# Patient Record
Sex: Male | Born: 2005 | Race: White | Hispanic: No | Marital: Single | Smoking: Never smoker
Health system: Southern US, Community
[De-identification: ages and names within clinical notes are randomized; demographics above are authoritative.]

---
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
## 2023-05-22 IMAGING — DX DG KNEE 1-2V*L*
2 series · 2 of 2 positions shown · non-contrast
Comparison: None.

CLINICAL DATA: Left knee pain after marathon 1 month ago.

EXAM:
LEFT KNEE - 1-2 VIEW

[dg knee 1-2 views left (1 of 2)]
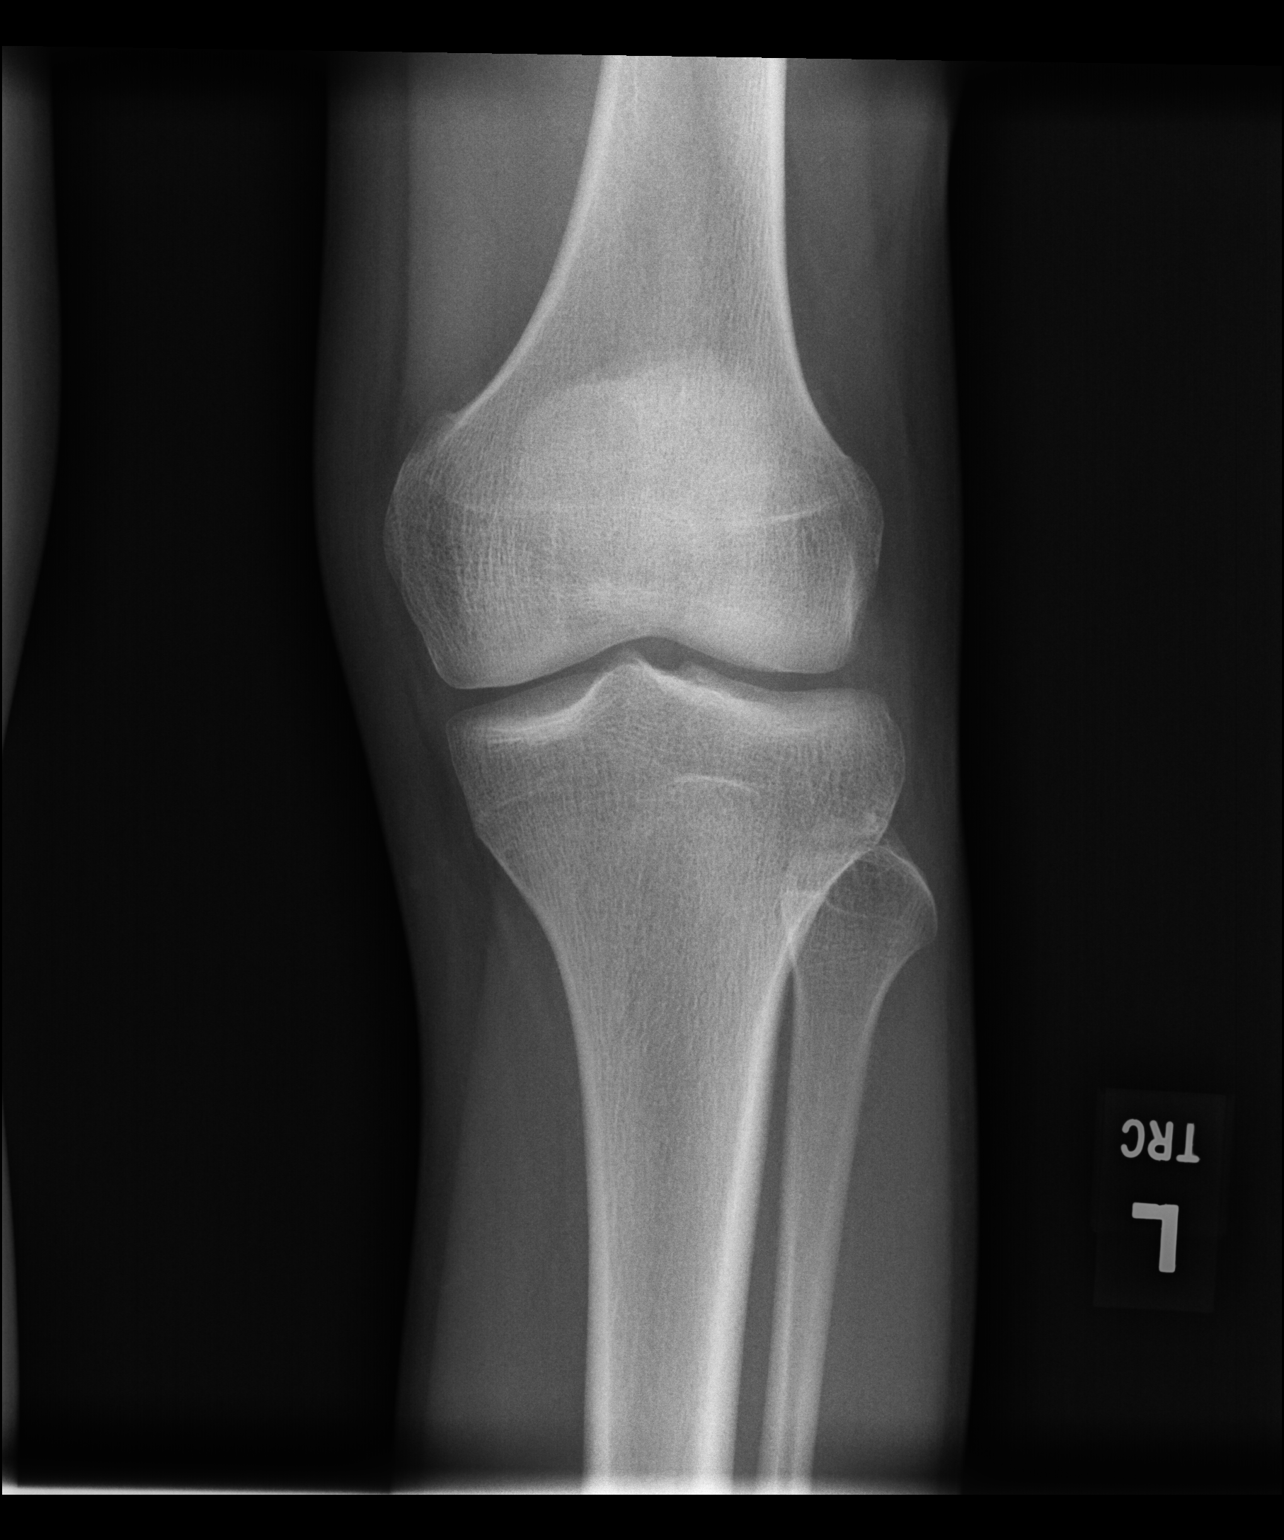

[dg knee 1-2 views left (2 of 2)]
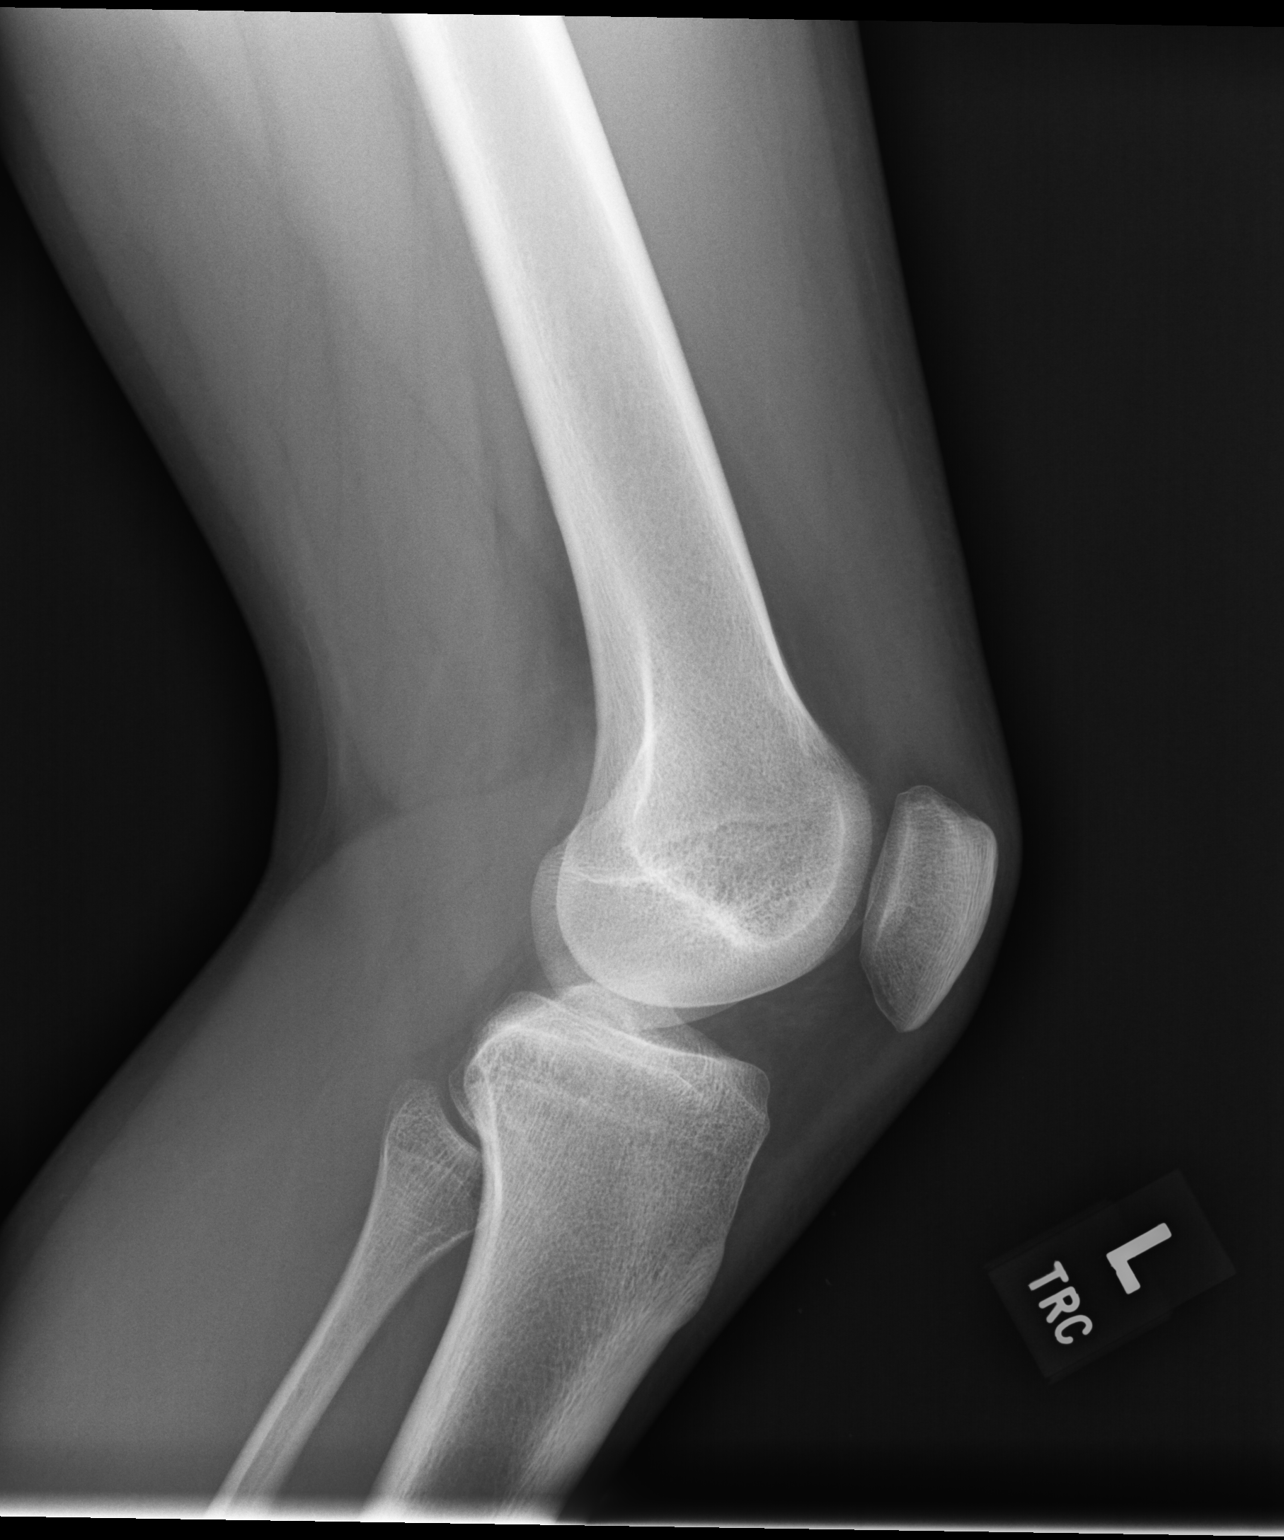

[2 of 2 positions shown; findings below may reference images not displayed]

FINDINGS: No evidence of fracture, dislocation, or joint effusion. No evidence
of arthropathy or other focal bone abnormality. Soft tissues are
unremarkable.
IMPRESSION: Negative.
